# Patient Record
Sex: Male | Born: 1990 | Race: White | Hispanic: No | Marital: Single | Smoking: Never smoker
Health system: Southern US, Community
[De-identification: ages and names within clinical notes are randomized; demographics above are authoritative.]

---

## 2020-10-16 ENCOUNTER — Emergency Department (HOSPITAL_BASED_OUTPATIENT_CLINIC_OR_DEPARTMENT_OTHER): Payer: Worker's Compensation | Admitting: Radiology

## 2020-10-16 ENCOUNTER — Encounter (HOSPITAL_BASED_OUTPATIENT_CLINIC_OR_DEPARTMENT_OTHER): Payer: Self-pay | Admitting: Emergency Medicine

## 2020-10-16 ENCOUNTER — Other Ambulatory Visit: Payer: Self-pay

## 2020-10-16 ENCOUNTER — Emergency Department (HOSPITAL_BASED_OUTPATIENT_CLINIC_OR_DEPARTMENT_OTHER)
Admission: EM | Admit: 2020-10-16 | Discharge: 2020-10-16 | Disposition: A | Discharge status: Home / Self Care | Payer: Worker's Compensation | Attending: Emergency Medicine | Admitting: Emergency Medicine

## 2020-10-16 DIAGNOSIS — Z23 Encounter for immunization: Secondary | ICD-10-CM | POA: Insufficient documentation

## 2020-10-16 DIAGNOSIS — Y990 Civilian activity done for income or pay: Secondary | ICD-10-CM | POA: Insufficient documentation

## 2020-10-16 DIAGNOSIS — S5781XA Crushing injury of right forearm, initial encounter: Secondary | ICD-10-CM | POA: Diagnosis not present

## 2020-10-16 DIAGNOSIS — T148XXA Other injury of unspecified body region, initial encounter: Secondary | ICD-10-CM

## 2020-10-16 DIAGNOSIS — S471XXA Crushing injury of right shoulder and upper arm, initial encounter: Secondary | ICD-10-CM

## 2020-10-16 DIAGNOSIS — W3182XA Contact with other commercial machinery, initial encounter: Secondary | ICD-10-CM | POA: Diagnosis not present

## 2020-10-16 DIAGNOSIS — Y9389 Activity, other specified: Secondary | ICD-10-CM | POA: Insufficient documentation

## 2020-10-16 MED ORDER — TETANUS-DIPHTH-ACELL PERTUSSIS 5-2.5-18.5 LF-MCG/0.5 IM SUSY
0.5000 mL | PREFILLED_SYRINGE | Freq: Once | INTRAMUSCULAR | Status: AC
  Administered 2020-10-16: 0.5 mL via INTRAMUSCULAR
  Filled 2020-10-16: qty 0.5
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
## 2020-10-16 NOTE — ED Notes (Signed)
Accidentally clicked off DG Hand Complete Right

## 2020-10-16 NOTE — Discharge Instructions (Addendum)
Return to emergency room if you have increased swelling, increased pain in the arm, numbness in your fingers or difficulty moving your hand.  You can take ibuprofen (advil/motrin) every 6-8 hours and Tylenol every 4-6 hours.  Follow-up with a hand specialist as discussed.

## 2020-10-16 NOTE — ED Notes (Signed)
This RN presented the AVS utilizing Teachback Method. Patient verbalizes understanding of Discharge Instructions. Opportunity for Questioning and Answers were provided. Patient Discharged from ED ambulatory to Home with Friend  

## 2020-10-16 NOTE — ED Provider Notes (Addendum)
[facility] EMERGENCY DEPT Provider Note   CSN: [id number] Arrival date & time: 10/16/20  1656     History Chief Complaint  Patient presents with  . Arm Injury    Daniel Sheppard is a 30 y.o. male.  Patient is a 30 year old male who presents with a crush injury to his right arm at work.  He was working with fabric and his hand got pulled into the machine.  The interpreter at bedside states that it was in the machine for about 1 to 2 minutes and they were able to free it.  He has a hard abrasion present.  He denies any numbness or weakness to the hand.  His tetanus shot is not up-to-date.  He denies any other injuries.  This happened just prior to arrival.        History reviewed. No pertinent past medical history.  There are no problems to display for this patient.   History reviewed. No pertinent surgical history.     No family history on file.  Social History   Tobacco Use  . Smoking status: Never Smoker  Substance Use Topics  . Alcohol use: Not Currently  . Drug use: Not Currently    Home Medications Prior to Admission medications   Not on File    Allergies    Patient has no known allergies.  Review of Systems   Review of Systems  Constitutional: Negative for fever.  Gastrointestinal: Negative for nausea and vomiting.  Musculoskeletal: Positive for arthralgias, joint swelling and myalgias. Negative for back pain and neck pain.  Skin: Positive for wound.  Neurological: Negative for weakness, numbness and headaches.    Physical Exam Updated Vital Signs BP 115/74   Pulse (!) 58   Temp 98.6 F (37 C) (Oral)   Resp 20   SpO2 100%   Physical Exam Constitutional:      Appearance: He is well-developed.  HENT:     Head: Normocephalic and atraumatic.  Cardiovascular:     Rate and Rhythm: Normal rate.  Pulmonary:     Effort: Pulmonary effort is normal.  Musculoskeletal:        General: Tenderness present.     Cervical back:  Normal range of motion and neck supple.     Comments: Positive abrasion throughout the dorsum of the arm extending from the dorsal surface of the hand up to the mid forearm.  There is no active bleeding.  No deeper lacerations.  Minimal swelling is noted.  Compartments are soft throughout the forearm and hand.  He is able to move his fingers without significant pain.  He has normal sensation in all nerve distributions of the hand.  Motor function in the hand is intact.  He is able to flex and extend the wrist without difficulty.  Radial pulses are intact.  He has normal capillary refill distally.  Skin:    General: Skin is warm and dry.  Neurological:     Mental Status: He is alert and oriented to person, place, and time.     ED Results / Procedures / Treatments   Labs (all labs ordered are listed, but only abnormal results are displayed) Labs Reviewed - No data to display  EKG None  Radiology DG Forearm Right  Result Date: 10/16/2020 CLINICAL DATA:  Crush injury. EXAM: RIGHT FOREARM - 2 VIEW COMPARISON:  None. FINDINGS: Cortical margins of the radius and ulna are intact. There is no evidence of fracture or other focal bone lesions. Wrist and elbow  alignment are maintained. Mild soft tissue edema. No radiopaque foreign body. IMPRESSION: Mild soft tissue edema. No fracture. Electronically Signed   By: Narda Rutherford M.D.   On: 10/16/2020 18:04   DG Hand Complete Right  Result Date: 10/16/2020 CLINICAL DATA:  Crush injury. EXAM: RIGHT HAND - COMPLETE 3+ VIEW COMPARISON:  None. FINDINGS: There is no evidence of fracture or dislocation. There is no evidence of arthropathy or other focal bone abnormality. Soft tissue edema overlies the dorsum of the hand. No radiopaque foreign body. IMPRESSION: Dorsal soft tissue edema. No fracture or subluxation. Electronically Signed   By: Narda Rutherford M.D.   On: 10/16/2020 18:04    Procedures Procedures   Medications Ordered in ED Medications   Tdap (BOOSTRIX) injection 0.5 mL (has no administration in time range)    ED Course  I have reviewed the triage vital signs and the nursing notes.  Pertinent labs & imaging results that were available during my care of the patient were reviewed by me and considered in my medical decision making (see chart for details).    MDM Rules/Calculators/A&P                          No fractures are identified on x-ray.  There is no evidence for compartment syndrome.  His wounds are superficial.  They were cleaned and dressed with antibiotic ointment.  He was given ongoing wound care instructions.  He was advised to keep his arm elevated to assist with swelling.  He was given signs to look out for for possible compartment syndrome and advised to return if he has any the symptoms.  He was given referral to follow-up with hand.  He was advised that he will have to call make an appointment.  Return precautions were given. Final Clinical Impression(s) / ED Diagnoses Final diagnoses:  Abrasion  Crush injury arm, right, initial encounter    Rx / DC Orders ED Discharge Orders    None       Rolan Bucco, MD 10/16/20 1851    Rolan Bucco, MD 10/16/20 [phone]

## 2020-10-16 NOTE — ED Notes (Signed)
Right hand cleansed and dressed with bacitracin and nonadherent dressing applied.

## 2020-10-16 NOTE — ED Triage Notes (Signed)
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 States a machine fell on pt at work - unable to report how heavy machine was, states machine was on arm for approx 2-3 minutes.

## 2020-12-21 ENCOUNTER — Ambulatory Visit (INDEPENDENT_AMBULATORY_CARE_PROVIDER_SITE_OTHER): Payer: BC Managed Care – PPO | Admitting: Family Medicine

## 2020-12-21 ENCOUNTER — Other Ambulatory Visit: Payer: Self-pay

## 2020-12-21 DIAGNOSIS — G8929 Other chronic pain: Secondary | ICD-10-CM

## 2020-12-21 DIAGNOSIS — M545 Low back pain, unspecified: Secondary | ICD-10-CM | POA: Diagnosis not present
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
## 2021-01-18 ENCOUNTER — Ambulatory Visit: Payer: Medicaid Other | Attending: Family Medicine

## 2021-01-18 ENCOUNTER — Other Ambulatory Visit: Payer: Self-pay

## 2021-01-18 DIAGNOSIS — G8929 Other chronic pain: Secondary | ICD-10-CM | POA: Diagnosis present

## 2021-01-18 DIAGNOSIS — M545 Low back pain, unspecified: Secondary | ICD-10-CM | POA: Insufficient documentation

## 2021-01-18 DIAGNOSIS — R262 Difficulty in walking, not elsewhere classified: Secondary | ICD-10-CM | POA: Diagnosis present
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
## 2021-01-18 NOTE — Patient Instructions (Signed)
Access Code: [email or web address] URL: [email or web address] Date: 01/18/2021 Prepared by: Sharol Roussel  Exercises Quadruped Hip Hike on Foam - 1 x daily - 7 x weekly - 1 sets - 10 reps Supine Bridge with Mini Swiss Ball Between Knees - 1 x daily - 7 x weekly - 1 sets - 20 reps - 5 hold Supine Posterior Pelvic Tilt - 1 x daily - 7 x weekly - 1 sets - 15 reps - 5 hold

## 2021-01-27 ENCOUNTER — Ambulatory Visit: Payer: Medicaid Other

## 2021-01-27 ENCOUNTER — Other Ambulatory Visit: Payer: Self-pay

## 2021-01-27 DIAGNOSIS — G8929 Other chronic pain: Secondary | ICD-10-CM

## 2021-01-27 DIAGNOSIS — M545 Low back pain, unspecified: Secondary | ICD-10-CM | POA: Diagnosis not present

## 2021-01-27 DIAGNOSIS — R262 Difficulty in walking, not elsewhere classified: Secondary | ICD-10-CM

## 2021-01-27 NOTE — Patient Instructions (Signed)
  [id number]

## 2021-01-27 NOTE — Therapy (Signed)
[facility] [address] [place], [place], [address] Phone: [phone]   Fax:  [phone]  Physical Therapy Treatment  Patient Details  Name: Daniel Sheppard MRN: [id number] Date of Birth: 06/20/1990 Referring Provider (PT): Lavada Mesi, MD   Encounter Date: 01/27/2021   PT End of Session - 01/27/21 1555     Visit Number 2    Number of Visits 16    Date for PT Re-Evaluation 03/15/21    Authorization Type [place] M[facility]    Authorization Time Period 3 visits approved from 01/27/2021- 02/16/2021    Authorization - Visit Number 1    Authorization - Number of Visits 3    Progress Note Due on Visit 3    PT Start Time 1445    PT Stop Time 1530    PT Time Calculation (min) 45 min    Activity Tolerance Patient tolerated treatment well    Behavior During Therapy [facility] for tasks assessed/performed             History reviewed. No pertinent past medical history.  History reviewed. No pertinent surgical history.  There were no vitals filed for this visit.   Subjective Assessment - 01/27/21 1449     Subjective Pt reports that while sitting, his back feels fine, but when at work his back hurts and this leads to shooting pain down his R LE to his big toe. He reports that he has been doing his HEP regularly, although his pain presentation has not changed since starting.    Patient is accompained by: Interpreter   Alice, [phone]   Limitations Standing;Walking;Lifting    Currently in Pain? No/denies    Pain Score 0-No pain                [facility] PT Assessment - 01/27/21 0001       Palpation   Spinal mobility Hypermobile CPA's L1-L5, no reproduction on pain      Special Tests    Special Tests Lumbar    Other special tests Plank test (+), pt terminated at 35 sec due to pain    Lumbar Tests other      other   Findings Positive    Comments Directional preference established into lumbar extension (centralization of pain after  20 reps)                           [facility] Adult PT Treatment/Exercise - 01/27/21 0001       Lumbar Exercises: Standing   Other Standing Lumbar Exercises Repeated trunk extension 3x10      Lumbar Exercises: Supine   Large Ball Abdominal Isometric Limitations 3x30sec with green physioball      Lumbar Exercises: Sidelying   Other Sidelying Lumbar Exercises Trunk side bend with hips on Bosu ball on table 2x10 BIL      Lumbar Exercises: Prone   Other Prone Lumbar Exercises Trunk extension with hips on Bosu ball on table 3x10                    PT Education - 01/27/21 1555     Education Details Updated HEP    Person(s) Educated Patient    Methods Explanation;Demonstration;Handout    Comprehension Verbalized understanding;Returned demonstration              PT Short Term Goals - 01/18/21 1616       PT SHORT TERM GOAL #1   Title The  patient will be independent with a basic HEP.    Time 2    Period Weeks    Status New    Target Date 02/01/21               PT Long Term Goals - 01/18/21 1617       PT LONG TERM GOAL #1   Title The patient will be able to stand for 1 hour with low back pain under 4/10.    Baseline 9/10    Time 8    Period Weeks    Status New    Target Date 03/15/21      PT LONG TERM GOAL #2   Title The patient will be independent with an advanced HEP    Baseline no HEP    Time 8    Period Weeks    Status New    Target Date 03/15/21      PT LONG TERM GOAL #3   Title The patient will be able to walk 1-2 mile with low back pain under 4/10    Baseline 9/10 pain    Time 8    Period Weeks    Status New    Target Date 03/15/21                   Plan - 01/27/21 1556     Clinical Impression Statement Pt responded well to all treatment today. Upon additional assessment of his lumbar spine, ruling up likelihood of lumbar spinal instability; The pt demonstrated a positive plank test and hypermobility  throughout the lumbar spine. Additionally, the pt demonstrates a directional preference into lumbar extension. He responded well to all interventions today, with good form and no increase in pain with all exercises. He will continue to benefit from skilled PT to address his primary impairments and return to his prior level of function without limitation.    Personal Factors and Comorbidities Social Background    Examination-Activity Limitations Stand;Lift;Locomotion Level    Examination-Participation Restrictions Occupation;Meal Prep;Cleaning;Yard Work;Shop    Stability/Clinical Decision Making Stable/Uncomplicated    Clinical Decision Making Low    Rehab Potential Good    PT Frequency 2x / week    PT Duration 8 weeks    PT Treatment/Interventions ADLs/Self Care Home Management;Aquatic Therapy;Cryotherapy;Electrical Stimulation;Ultrasound;Traction;Moist Heat;Iontophoresis 4mg /ml Dexamethasone;Gait training;Stair training;Functional mobility training;Therapeutic activities;Therapeutic exercise;Neuromuscular re-education;Patient/family education;Manual techniques;Joint Manipulations;Spinal Manipulations;Dry needling;Taping    PT Next Visit Plan Core/ hip strengthening    PT Home Exercise Plan Access Code:    Consulted and Agree with Plan of Care Patient             Patient will benefit from skilled therapeutic intervention in order to improve the following deficits and impairments:  Difficulty walking, Decreased range of motion, Decreased activity tolerance, Pain, Decreased mobility, Decreased strength  Visit Diagnosis: Chronic bilateral low back pain without sciatica  Difficulty in walking, not elsewhere classified     Problem List There are no problems to display for this patient.   [id number], PT, DPT 01/27/21 4:04 PM   [facility] [address] [place], [place], [place] Phone: [phone]   Fax:   [phone]  Name: Daniel Sheppard MRN: Tito Dine Date of Birth: 1990/11/11

## 2021-02-01 ENCOUNTER — Ambulatory Visit: Payer: Medicaid Other

## 2021-02-16 ENCOUNTER — Ambulatory Visit: Payer: Medicaid Other | Attending: Family Medicine

## 2021-02-16 ENCOUNTER — Other Ambulatory Visit: Payer: Self-pay

## 2021-02-16 DIAGNOSIS — R262 Difficulty in walking, not elsewhere classified: Secondary | ICD-10-CM | POA: Insufficient documentation

## 2021-02-16 DIAGNOSIS — M545 Low back pain, unspecified: Secondary | ICD-10-CM | POA: Diagnosis not present

## 2021-02-16 DIAGNOSIS — G8929 Other chronic pain: Secondary | ICD-10-CM | POA: Diagnosis present

## 2021-02-16 NOTE — Therapy (Addendum)
[place], [place], [address] Phone: [phone]   Fax:  [phone]  Physical Therapy Treatment/ Discharge Summary  Progress Note Reporting Period 01/18/2021 to 02/16/2021   See note below for Objective Data and Assessment of Progress/Goals.      Patient Details  Name: Daniel Sheppard MRN: [id number] Date of Birth: May 14, 1991 Referring Provider (PT): Eunice Blase, MD   Encounter Date: 02/16/2021   PT End of Session - 02/16/21 1533     Visit Number 3    Number of Visits 16    Date for PT Re-Evaluation 03/15/21    Authorization Type [place] M[facility]    Authorization Time Period 3 visits approved from 01/27/2021- 02/16/2021    Authorization - Visit Number 2    Authorization - Number of Visits 3    PT Start Time 0354    PT Stop Time 1530    PT Time Calculation (min) 45 min    Activity Tolerance Patient tolerated treatment well    Behavior During Therapy [facility] for tasks assessed/performed             History reviewed. No pertinent past medical history.  History reviewed. No pertinent surgical history.  There were no vitals filed for this visit.   Subjective Assessment - 02/16/21 1450     Subjective Pt reports that his low back pain and R LE sxs are similar to when he started PT, but he no longer has L LE sxs. He reports being moderately adherent to his HEP, not performing all his exercises due to having a cold.    Patient is accompained by: Interpreter   Alene Mires, [phone]   How long can you stand comfortably? 10-15 minutes    How long can you walk comfortably? 20-30 minutes    Currently in Pain? No/denies    Pain Score 0-No pain                [facility] PT Assessment - 02/16/21 0001       AROM   Lumbar Flexion 100%    Lumbar Extension 60% with pain    Lumbar - Right Side Bend WNL    Lumbar - Left Side Bend WNL    Lumbar - Right Rotation WNL    Lumbar - Left Rotation WNL      Strength    Right Hip Flexion 4+/5    Left Hip Flexion 4+/5                           [facility] Adult PT Treatment/Exercise - 02/16/21 0001       Lumbar Exercises: Stretches   Other Lumbar Stretch Exercise Cobra stretch 3x30sec      Lumbar Exercises: Standing   Other Standing Lumbar Exercises Repeated trunk extension 3x10      Lumbar Exercises: Seated   Other Seated Lumbar Exercises Sciatic nerve glides 2x20 on R      Lumbar Exercises: Sidelying   Other Sidelying Lumbar Exercises Trunk side bend with hips on Bosu ball on table 2x10 BIL      Lumbar Exercises: Prone   Other Prone Lumbar Exercises Trunk extension with hips on Bosu ball on table 3x10    Other Prone Lumbar Exercises Standard plank 3x to exhaustion                     PT Education - 02/16/21 1530     Education Details Updated HEP.  Educated pt on significance of progress made thus far in PT, along with POC moving forward.    Person(s) Educated Patient    Methods Explanation;Demonstration;Handout    Comprehension Returned demonstration;Verbalized understanding              PT Short Term Goals - 02/16/21 1502       PT SHORT TERM GOAL #1   Title The patient will be independent with a basic HEP.    Baseline pt reports adherence to his HEP    Time 2    Period Weeks    Status Achieved    Target Date 02/01/21               PT Long Term Goals - 02/16/21 1536       PT LONG TERM GOAL #1   Title The patient will be able to stand for 1 hour with low back pain under 4/10.    Baseline 10-15 minutes with 5/10 pain: 02/16/2021    Time 8    Period Weeks    Status On-going      PT LONG TERM GOAL #2   Title The patient will be independent with an advanced HEP    Baseline Adherent    Time 8    Period Weeks    Status Achieved      PT LONG TERM GOAL #3   Title The patient will be able to walk 1-2 mile with low back pain under 4/10    Baseline able to walk about 30 minutes before moderate  (5/10) pain    Time 8    Period Weeks    Status On-going      PT LONG TERM GOAL #4   Title Pt will achieve centralization of pain with no LE sxs in order to work without limitation.    Baseline radicular sxs into R LE    Time 4    Period Weeks    Status New    Target Date 03/15/21                   Plan - 02/16/21 1534     Clinical Impression Statement Pt responded well to all interventions today, with good form and no increase in pain with all exercises. He reports improved functional ability since starting PT, with improved standing and walking times. He also has improved spinal mobility and shows early signs of centralization of pain with no sxs in his L LE over the past week. He will continue to benefit from skilled PT to address his primary impairments and return to his prior level of function without limitation.    Personal Factors and Comorbidities Social Background    Examination-Activity Limitations Stand;Lift;Locomotion Level    Examination-Participation Restrictions Occupation;Meal Prep;Cleaning;Yard Work;Shop    Stability/Clinical Decision Making Stable/Uncomplicated    Clinical Decision Making Low    Rehab Potential Good    PT Frequency 2x / week    PT Duration 8 weeks    PT Treatment/Interventions ADLs/Self Care Home Management;Aquatic Therapy;Cryotherapy;Electrical Stimulation;Ultrasound;Traction;Moist Heat;Iontophoresis 34m/ml Dexamethasone;Gait training;Stair training;Functional mobility training;Therapeutic activities;Therapeutic exercise;Neuromuscular re-education;Patient/family education;Manual techniques;Joint Manipulations;Spinal Manipulations;Dry needling;Taping    PT Next Visit Plan Core/ hip strengthening, manual PRN    PT Home Exercise Plan Access Code: [email or web address]   Consulted and Agree with Plan of Care Patient             Patient will benefit from skilled therapeutic intervention in order to improve the following deficits and impairments:  Difficulty walking, Decreased range of motion, Decreased activity tolerance, Pain, Decreased mobility, Decreased strength  Visit Diagnosis: Chronic bilateral low back pain without sciatica  Difficulty in walking, not elsewhere classified     Problem List There are no problems to display for this patient.   Vanessa Petroleum, PT, DPT 02/16/21 3:40 PM   [place] [facility] [address] [place], [place], [address] Phone: [phone]   Fax:  [phone]  Name: Daniel Sheppard MRN: [id number] Date of Birth: 22-May-1991  PHYSICAL THERAPY DISCHARGE SUMMARY  Visits from Start of Care: 3  Current functional level related to goals / functional outcomes: Unable to assess   Remaining deficits: Unable to assess   Education / Equipment: HEP   Patient agrees to discharge. Patient goals were not met. Patient is being discharged due to not returning since the last visit.  Vanessa Piatt, PT, DPT 03/22/21 9:08 AM

## 2021-02-16 NOTE — Patient Instructions (Signed)
  [id number] 

## 2021-05-10 NOTE — Congregational Nurse Program (Signed)
  Dept: [phone]   Congregational Nurse Program Note  Date of Encounter: 05/10/2021  Past Medical History: No past medical history on file.  Encounter Details:  CNP Questionnaire - 05/10/21 1255       Questionnaire   Location Patient Served  [facility]    Visit Setting Church or Organization    Patient Status Refugee    Insurance [facility]    Insurance Referral N/A    Medication N/A    Medical Provider No    Medical Referral [facility]    Medical Appointment Made [facility]    Food N/A    Transportation N/A    Housing/Utilities N/A    Interpersonal Safety N/A    Intervention [employer];Case Management;Support    ED Visit Averted Yes    Life-Saving Intervention Made N/A            Patient came in c/o burning sensation while voiding with some penile discharge. Referred to [facility].  Nicole Cella Errol Ala RN BSn PCCN  [facility] [phone]

## 2021-06-30 ENCOUNTER — Other Ambulatory Visit: Payer: Self-pay

## 2021-06-30 ENCOUNTER — Encounter: Payer: Self-pay | Admitting: Family

## 2021-06-30 ENCOUNTER — Other Ambulatory Visit (HOSPITAL_COMMUNITY)
Admission: RE | Admit: 2021-06-30 | Discharge: 2021-06-30 | Disposition: A | Discharge status: Home / Self Care | Payer: BC Managed Care – PPO | Source: Ambulatory Visit | Attending: Family | Admitting: Family

## 2021-06-30 ENCOUNTER — Ambulatory Visit: Payer: BC Managed Care – PPO | Admitting: Family

## 2021-06-30 VITALS — BP 108/59 | HR 64 | Temp 98.5°F | Ht 74.0 in | Wt 165.6 lb

## 2021-06-30 DIAGNOSIS — R102 Pelvic and perineal pain: Secondary | ICD-10-CM

## 2021-06-30 DIAGNOSIS — M5441 Lumbago with sciatica, right side: Secondary | ICD-10-CM

## 2021-06-30 DIAGNOSIS — G8929 Other chronic pain: Secondary | ICD-10-CM | POA: Diagnosis not present

## 2021-06-30 LAB — POCT URINALYSIS DIPSTICK
Bilirubin, UA: NEGATIVE
Blood, UA: NEGATIVE
Glucose, UA: NEGATIVE
Ketones, UA: NEGATIVE
Leukocytes, UA: NEGATIVE
Nitrite, UA: NEGATIVE
Protein, UA: NEGATIVE
Spec Grav, UA: 1.02 (ref 1.010–1.025)
Urobilinogen, UA: 0.2 E.U./dL
pH, UA: 6.5 (ref 5.0–8.0)

## 2021-06-30 NOTE — Assessment & Plan Note (Addendum)
pt reports "bladder pain" hurts after urination and pain in right lumbar. denies hematuria or hx of STD or nephrolithiasis. UA neg. advised on proper daily water hydration, not holding urine during day.

## 2021-06-30 NOTE — Patient Instructions (Addendum)
It was very nice to see you today!  Contact the Orthopedic office that saw you for your back pain last summer for follow up.  Located at [address] [place]. [phone]  Your urine is negative for any urinary tract infection, I am sending it out to test for other sexually transmitted diseases that can cause your symptoms and we will call you in a few days with the results. Be sure to drink at least 2 liters of water daily so that your urine is light yellow or clear in color for keeping a healthy bladder.    PLEASE NOTE:  If you had any lab tests please let us know if you have not heard back within a few days. You may see your results on MyChart before we have a chance to review them but we will give you a call once they are reviewed by [name]. If we ordered any referrals today, please let us know if you have not heard from their office within the next week.   Please try these tips to maintain a healthy lifestyle:  Eat most of your calories during the day when you are active. Eliminate processed foods including packaged sweets (pies, cakes, cookies), reduce intake of potatoes, white bread, white pasta, and white rice. Look for whole grain options, oat flour or almond flour.  Each meal should contain half fruits/vegetables, one quarter protein, and one quarter carbs (no bigger than a computer mouse).  Cut down on sweet beverages. This includes juice, soda, and sweet tea. Also watch fruit intake, though this is a healthier sweet option, it still contains natural sugar! Limit to 3 servings daily.  Drink at least 1 glass of water with each meal and aim for at least 8 glasses per day  Exercise at least 150 minutes every week.

## 2021-06-30 NOTE — Progress Notes (Signed)
Subjective:     Patient ID: Daniel Sheppard, male    DOB: [birth year], 31 y.o.   MRN: [id number]  Chief Complaint  Patient presents with   Establish Care   Back Pain    Lower; Starting a few years ago.    Urinary Urgency    HPI: Pain He reports chronic back pain. There was not an injury that may have caused the pain. The pain started  7 months ago and is gradually worsening. The pain does radiate right leg. The pain is described as burning and throbbing, is moderate in intensity, occurring intermittently. Symptoms are worse in the: evening, nighttime  Aggravating factors: sitting, standing, walking, and walking uphill He has tried application of heat, NSAIDs, and physical thearpy with mild relief.   Urinary symptoms: Patient c/o  dysuria,  pelvic pain, low back pain, incomplete emptying of bladder. Other sx: penile d/c none, penile itching none. Duration of sx: 3 weeks; Home tx: nothing; Denies  nausea, fever. Reports last UTI years ago in [place].     Health Maintenance Due  Topic Date Due   HIV Screening  Never done   Hepatitis C Screening  Never done    History reviewed. No pertinent past medical history.  History reviewed. No pertinent surgical history.  No outpatient medications prior to visit.   No facility-administered medications prior to visit.    No Known Allergies      Objective:    Physical Exam Vitals and nursing note reviewed.  Constitutional:      General: He is not in acute distress.    Appearance: Normal appearance.  HENT:     Head: Normocephalic.  Cardiovascular:     Rate and Rhythm: Normal rate and regular rhythm.  Pulmonary:     Effort: Pulmonary effort is normal.     Breath sounds: Normal breath sounds.  Musculoskeletal:        General: Normal range of motion.     Cervical back: Normal range of motion.  Skin:    General: Skin is warm and dry.  Neurological:     Mental Status: He is alert and oriented to person, place, and time.   Psychiatric:        Mood and Affect: Mood normal.   BP (!) 108/59    Pulse 64    Temp 98.5 F (36.9 C) (Temporal)    Ht 6\' 2"  (1.88 m)    Wt 165 lb 9.6 oz (75.1 kg)    SpO2 96%    BMI 21.26 kg/m  Wt Readings from Last 3 Encounters:  06/30/21 165 lb 9.6 oz (75.1 kg)       Assessment & Plan:   Problem List Items Addressed This Visit       Nervous and Auditory   Chronic bilateral low back pain with right-sided sciatica    pt has been evaluated last summer, told to f/u if PT did not resolve sx. he states it helped, but sx are returning, he was told to return for f/u, providing phone # today to call.         Other   Pelvic pain - Primary    pt reports "bladder pain" hurts after urination and pain in right lumbar. denies hematuria or hx of STD or nephrolithiasis. UA neg. advised on proper daily water hydration, not holding urine during day.       Relevant Orders   POCT Urinalysis Dipstick (Completed)   Urine cytology ancillary only   *Due to  language barrier, an interpreter was present during the history-taking and subsequent discussion (and for part of the physical exam) with this patient.

## 2021-06-30 NOTE — Assessment & Plan Note (Signed)
pt has been evaluated last summer, told to f/u if PT did not resolve sx. he states it helped, but sx are returning, he was told to return for f/u, providing phone # today to call.

## 2021-07-01 LAB — URINE CYTOLOGY ANCILLARY ONLY
Bacterial Vaginitis-Urine: NEGATIVE
Candida Urine: NEGATIVE
Chlamydia: NEGATIVE
Comment: NEGATIVE
Comment: NEGATIVE
Comment: NORMAL
Neisseria Gonorrhea: NEGATIVE
Trichomonas: NEGATIVE

## 2021-07-01 NOTE — Progress Notes (Signed)
urine negative for chlamydia, gonorrhea,  trichomonas, or yeast.

## 2021-07-06 ENCOUNTER — Other Ambulatory Visit: Payer: Self-pay

## 2021-07-06 ENCOUNTER — Encounter: Payer: Self-pay | Admitting: Orthopaedic Surgery

## 2021-07-06 ENCOUNTER — Ambulatory Visit: Payer: Self-pay

## 2021-07-06 ENCOUNTER — Ambulatory Visit: Payer: BC Managed Care – PPO | Admitting: Orthopaedic Surgery

## 2021-07-06 DIAGNOSIS — G8929 Other chronic pain: Secondary | ICD-10-CM | POA: Diagnosis not present

## 2021-07-06 DIAGNOSIS — M545 Low back pain, unspecified: Secondary | ICD-10-CM | POA: Diagnosis not present

## 2021-07-06 MED ORDER — DICLOFENAC SODIUM 75 MG PO TBEC: 75.0000 mg | DELAYED_RELEASE_TABLET | Freq: Two times a day (BID) | ORAL | 2 refills | Status: DC | PRN
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
## 2021-07-23 ENCOUNTER — Other Ambulatory Visit: Payer: Self-pay

## 2021-07-23 ENCOUNTER — Ambulatory Visit (INDEPENDENT_AMBULATORY_CARE_PROVIDER_SITE_OTHER): Payer: BC Managed Care – PPO | Admitting: Orthopaedic Surgery

## 2021-07-23 DIAGNOSIS — M545 Low back pain, unspecified: Secondary | ICD-10-CM

## 2021-07-23 DIAGNOSIS — G8929 Other chronic pain: Secondary | ICD-10-CM

## 2021-07-28 ENCOUNTER — Other Ambulatory Visit: Payer: Self-pay

## 2021-07-28 ENCOUNTER — Ambulatory Visit
Admission: RE | Admit: 2021-07-28 | Discharge: 2021-07-28 | Disposition: A | Discharge status: Home / Self Care | Payer: BC Managed Care – PPO | Source: Ambulatory Visit | Attending: Orthopaedic Surgery | Admitting: Orthopaedic Surgery

## 2021-07-28 ENCOUNTER — Other Ambulatory Visit: Payer: BC Managed Care – PPO

## 2021-07-28 DIAGNOSIS — M545 Low back pain, unspecified: Secondary | ICD-10-CM

## 2021-07-28 DIAGNOSIS — G8929 Other chronic pain: Secondary | ICD-10-CM

## 2021-07-29 ENCOUNTER — Other Ambulatory Visit: Payer: BC Managed Care – PPO

## 2021-08-03 ENCOUNTER — Ambulatory Visit: Payer: BC Managed Care – PPO | Admitting: Orthopaedic Surgery

## 2021-08-04 ENCOUNTER — Ambulatory Visit: Payer: BC Managed Care – PPO | Admitting: Internal Medicine

## 2021-09-28 ENCOUNTER — Encounter: Payer: Self-pay | Admitting: Orthopaedic Surgery

## 2021-09-28 ENCOUNTER — Ambulatory Visit (INDEPENDENT_AMBULATORY_CARE_PROVIDER_SITE_OTHER): Payer: BC Managed Care – PPO | Admitting: Orthopaedic Surgery

## 2021-09-28 DIAGNOSIS — G8929 Other chronic pain: Secondary | ICD-10-CM | POA: Diagnosis not present

## 2021-09-28 DIAGNOSIS — M5441 Lumbago with sciatica, right side: Secondary | ICD-10-CM | POA: Diagnosis not present
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
## 2021-09-29 ENCOUNTER — Other Ambulatory Visit: Payer: Self-pay

## 2021-09-29 DIAGNOSIS — G8929 Other chronic pain: Secondary | ICD-10-CM

## 2021-10-20 ENCOUNTER — Ambulatory Visit: Payer: BC Managed Care – PPO | Admitting: Internal Medicine

## 2021-11-08 ENCOUNTER — Encounter: Payer: Self-pay | Admitting: Physical Medicine and Rehabilitation

## 2021-11-08 ENCOUNTER — Ambulatory Visit (INDEPENDENT_AMBULATORY_CARE_PROVIDER_SITE_OTHER): Payer: BC Managed Care – PPO | Admitting: Physical Medicine and Rehabilitation

## 2021-11-08 ENCOUNTER — Ambulatory Visit: Payer: Self-pay

## 2021-11-08 VITALS — BP 113/70 | HR 80

## 2021-11-08 DIAGNOSIS — M5416 Radiculopathy, lumbar region: Secondary | ICD-10-CM

## 2021-11-08 MED ORDER — METHYLPREDNISOLONE ACETATE 80 MG/ML IJ SUSP
80.0000 mg | Freq: Once | INTRAMUSCULAR | Status: AC
  Administered 2021-11-08: 80 mg

## 2021-11-08 NOTE — Patient Instructions (Signed)

## 2021-11-08 NOTE — Progress Notes (Signed)
Pt state lower back pain that  travels down both legs, mostly right. Pt state he has pain behind his knees.Pt state walking and standing makes the pain worse. Pt state he takes over the counter pain meds to help ease his pain.  Numeric Pain Rating Scale and Functional Assessment Average Pain 8   In the last MONTH (on 0-10 scale) has pain interfered with the following?  1. General activity like being  able to carry out your everyday physical activities such as walking, climbing stairs, carrying groceries, or moving a chair?  Rating(9)   +Driver, -BT, -Dye Allergies.

## 2021-11-23 NOTE — Procedures (Signed)
Lumbar Epidural Steroid Injection - Interlaminar Approach with Fluoroscopic Guidance  Patient: Daniel Sheppard      Date of Birth: 1991-05-06 MRN: [id number] PCP: [name], PA-C      Visit Date: 11/08/2021   Universal Protocol:     Consent Given By: the patient  Position: PRONE  Additional Comments: Vital signs were monitored before and after the procedure. Patient was prepped and draped in the usual sterile fashion. The correct patient, procedure, and site was verified.   Injection Procedure Details:   Procedure diagnoses: Lumbar radiculopathy [M54.16]   Meds Administered:  Meds ordered this encounter  Medications   methylPREDNISolone acetate (DEPO-MEDROL) injection 80 mg     Laterality: Right  Location/Site:  L5-S1  Needle: 3.5 in., 20 ga. Tuohy  Needle Placement: Paramedian epidural  Findings:   -Comments: Excellent flow of contrast into the epidural space.  Procedure Details: Using a paramedian approach from the side mentioned above, the region overlying the inferior lamina was localized under fluoroscopic visualization and the soft tissues overlying this structure were infiltrated with 4 ml. of 1% Lidocaine without Epinephrine. The Tuohy needle was inserted into the epidural space using a paramedian approach.   The epidural space was localized using loss of resistance along with counter oblique bi-planar fluoroscopic views.  After negative aspirate for air, blood, and CSF, a 2 ml. volume of Isovue-250 was injected into the epidural space and the flow of contrast was observed. Radiographs were obtained for documentation purposes.    The injectate was administered into the level noted above.   Additional Comments:  The patient tolerated the procedure well Dressing: 2 x 2 sterile gauze and Band-Aid    Post-procedure details: Patient was observed during the procedure. Post-procedure instructions were reviewed.  Patient left the clinic in stable  condition.

## 2021-11-23 NOTE — Progress Notes (Signed)
Daniel Sheppard - 31 y.o. male MRN [id number]  Date of birth: September 14, 1990  Office Visit Note: Visit Date: 11/08/2021 PCP: [name], PA-C Referred by: [name], PA-C  Subjective: Chief Complaint  Patient presents with   Lower Back - Pain   Right Leg - Pain   Left Leg - Pain   HPI:  Daniel Sheppard is a 31 y.o. male who comes in today at the request of Dr. Glee Arvin for planned Midline L5-S1 Lumbar Interlaminar epidural steroid injection with fluoroscopic guidance.  The patient has failed conservative care including home exercise, medications, time and activity modification.  This injection will be diagnostic and hopefully therapeutic.  Please see requesting physician notes for further details and justification.   ROS Otherwise per HPI.  Assessment & Plan: Visit Diagnoses:    ICD-10-CM   1. Lumbar radiculopathy  M54.16 XR C-ARM NO REPORT    Epidural Steroid injection    methylPREDNISolone acetate (DEPO-MEDROL) injection 80 mg      Plan: No additional findings.   Meds & Orders:  Meds ordered this encounter  Medications   methylPREDNISolone acetate (DEPO-MEDROL) injection 80 mg    Orders Placed This Encounter  Procedures   XR C-ARM NO REPORT   Epidural Steroid injection    Follow-up: Return if symptoms worsen or fail to improve.   Procedures: No procedures performed  Lumbar Epidural Steroid Injection - Interlaminar Approach with Fluoroscopic Guidance  Patient: Daniel Sheppard      Date of Birth: May 27, 1991 MRN: [id number] PCP: [name], PA-C      Visit Date: 11/08/2021   Universal Protocol:     Consent Given By: the patient  Position: PRONE  Additional Comments: Vital signs were monitored before and after the procedure. Patient was prepped and draped in the usual sterile fashion. The correct patient, procedure, and site was verified.   Injection Procedure Details:   Procedure diagnoses: Lumbar radiculopathy [M54.16]   Meds  Administered:  Meds ordered this encounter  Medications   methylPREDNISolone acetate (DEPO-MEDROL) injection 80 mg     Laterality: Right  Location/Site:  L5-S1  Needle: 3.5 in., 20 ga. Tuohy  Needle Placement: Paramedian epidural  Findings:   -Comments: Excellent flow of contrast into the epidural space.  Procedure Details: Using a paramedian approach from the side mentioned above, the region overlying the inferior lamina was localized under fluoroscopic visualization and the soft tissues overlying this structure were infiltrated with 4 ml. of 1% Lidocaine without Epinephrine. The Tuohy needle was inserted into the epidural space using a paramedian approach.   The epidural space was localized using loss of resistance along with counter oblique bi-planar fluoroscopic views.  After negative aspirate for air, blood, and CSF, a 2 ml. volume of Isovue-250 was injected into the epidural space and the flow of contrast was observed. Radiographs were obtained for documentation purposes.    The injectate was administered into the level noted above.   Additional Comments:  The patient tolerated the procedure well Dressing: 2 x 2 sterile gauze and Band-Aid    Post-procedure details: Patient was observed during the procedure. Post-procedure instructions were reviewed.  Patient left the clinic in stable condition.   Clinical History: MRI LUMBAR SPINE WITHOUT CONTRAST   TECHNIQUE: Multiplanar, multisequence MR imaging of the lumbar spine was performed. No intravenous contrast was administered.   COMPARISON:  X-ray lumbar 07/06/2021.   FINDINGS: Segmentation: 5 non rib-bearing lumbar type vertebral bodies are present. The lowest fully formed vertebral body  is L5.   Alignment: No significant listhesis is present. Straightening of the normal lumbar lordosis is noted.   Vertebrae:  Marrow signal and vertebral body heights are normal.   Conus medullaris and cauda equina: Conus  extends to the L1 level. Conus and cauda equina appear normal.   Paraspinal and other soft tissues: Limited imaging the abdomen is unremarkable. There is no significant adenopathy. No solid organ lesions are present.   Disc levels:   L1-2: Mild loss of disc height noted. No significant focal protrusion or stenosis.   L2-3: Negative.   L3-4: Mild disc bulging is asymmetric to the left. Mild bilateral subarticular and foraminal narrowing is worse left than right.   L4-5: Broad-based disc protrusion is present. Mild subarticular narrowing is present bilaterally. Mild bilateral foraminal narrowing is present.   L5-S1: Disc desiccation noted. A mild central annular tear is present. No significant focal stenosis is present.   IMPRESSION: 1. Mild bilateral subarticular and foraminal narrowing bilaterally at L3-4 is worse left than right. 2. Mild subarticular and foraminal narrowing bilaterally at L4-5. 3. Mild central annular tear at L5-S1 without significant stenosis. 4. Straightening of the normal lumbar lordosis. This is nonspecific but can be seen in the setting of muscle strain or ongoing pain.     Electronically Signed   By: Marin Roberts M.D.   On: 07/28/2021 16:39     Objective:  VS:  HT:    WT:   BMI:     BP:113/70  HR:80bpm  TEMP: ( )  RESP:  Physical Exam Vitals and nursing note reviewed.  Constitutional:      General: He is not in acute distress.    Appearance: Normal appearance. He is not ill-appearing.  HENT:     Head: Normocephalic and atraumatic.     Right Ear: External ear normal.     Left Ear: External ear normal.     Nose: No congestion.  Eyes:     Extraocular Movements: Extraocular movements intact.  Cardiovascular:     Rate and Rhythm: Normal rate.     Pulses: Normal pulses.  Pulmonary:     Effort: Pulmonary effort is normal. No respiratory distress.  Abdominal:     General: There is no distension.     Palpations: Abdomen is soft.   Musculoskeletal:        General: No tenderness or signs of injury.     Cervical back: Neck supple.     Right lower leg: No edema.     Left lower leg: No edema.     Comments: Patient has good distal strength without clonus.  Skin:    Findings: No erythema or rash.  Neurological:     General: No focal deficit present.     Mental Status: He is alert and oriented to person, place, and time.     Sensory: No sensory deficit.     Motor: No weakness or abnormal muscle tone.     Coordination: Coordination normal.  Psychiatric:        Mood and Affect: Mood normal.        Behavior: Behavior normal.      Imaging: No results found.

## 2022-01-18 ENCOUNTER — Ambulatory Visit (INDEPENDENT_AMBULATORY_CARE_PROVIDER_SITE_OTHER): Payer: BC Managed Care – PPO | Admitting: Orthopaedic Surgery

## 2022-01-18 DIAGNOSIS — M5416 Radiculopathy, lumbar region: Secondary | ICD-10-CM

## 2022-01-18 NOTE — Progress Notes (Signed)
Office Visit Note   Patient: Daniel Sheppard           Date of Birth: May 01, 1991           MRN: [id number] Visit Date: 01/18/2022              Requested by: [name], PA-C [address] [place],  [place] [address] PCP: [name], PA-C   Assessment & Plan: Visit Diagnoses:  1. Lumbar radiculopathy     Plan: Patient returns today with interpreter for continued back pain.  Underwent lumbar spine ESI with Dr. Alvester Morin about 2 months ago.  The injection helped for a month only.  Feels like the pain is traveling down his legs.  Based on findings and options he would like a referral to a spine specialist.  Referral has been made to [facility].  Follow-Up Instructions: No follow-ups on file.   Orders:  Orders Placed This Encounter  Procedures   Ambulatory referral to Neurosurgery   No orders of the defined types were placed in this encounter.     Procedures: No procedures performed   Clinical Data: No additional findings.   Subjective: Chief Complaint  Patient presents with   Lower Back - Pain    HPI  Review of Systems   Objective: Vital Signs: There were no vitals taken for this visit.  Physical Exam  Ortho Exam  Specialty Comments:  MRI LUMBAR SPINE WITHOUT CONTRAST   TECHNIQUE: Multiplanar, multisequence MR imaging of the lumbar spine was performed. No intravenous contrast was administered.   COMPARISON:  X-ray lumbar 07/06/2021.   FINDINGS: Segmentation: 5 non rib-bearing lumbar type vertebral bodies are present. The lowest fully formed vertebral body is L5.   Alignment: No significant listhesis is present. Straightening of the normal lumbar lordosis is noted.   Vertebrae:  Marrow signal and vertebral body heights are normal.   Conus medullaris and cauda equina: Conus extends to the L1 level. Conus and cauda equina appear normal.   Paraspinal and other soft tissues: Limited imaging the abdomen  is unremarkable. There is no significant adenopathy. No solid organ lesions are present.   Disc levels:   L1-2: Mild loss of disc height noted. No significant focal protrusion or stenosis.   L2-3: Negative.   L3-4: Mild disc bulging is asymmetric to the left. Mild bilateral subarticular and foraminal narrowing is worse left than right.   L4-5: Broad-based disc protrusion is present. Mild subarticular narrowing is present bilaterally. Mild bilateral foraminal narrowing is present.   L5-S1: Disc desiccation noted. A mild central annular tear is present. No significant focal stenosis is present.   IMPRESSION: 1. Mild bilateral subarticular and foraminal narrowing bilaterally at L3-4 is worse left than right. 2. Mild subarticular and foraminal narrowing bilaterally at L4-5. 3. Mild central annular tear at L5-S1 without significant stenosis. 4. Straightening of the normal lumbar lordosis. This is nonspecific but can be seen in the setting of muscle strain or ongoing pain.     Electronically Signed   By: Marin Roberts M.D.   On: 07/28/2021 16:39  Imaging: No results found.   PMFS History: Patient Active Problem List   Diagnosis Date Noted   Pelvic pain 06/30/2021   Chronic bilateral low back pain with right-sided sciatica 06/30/2021   No past medical history on file.  No family history on file.  No past surgical history on file. Social History   Occupational History   Not on file  Tobacco Use  Smoking status: Never   Smokeless tobacco: Not on file  Substance and Sexual Activity   Alcohol use: Not Currently   Drug use: Not Currently   Sexual activity: Not on file

## 2022-02-21 ENCOUNTER — Ambulatory Visit (INDEPENDENT_AMBULATORY_CARE_PROVIDER_SITE_OTHER): Payer: BC Managed Care – PPO | Admitting: Physical Medicine and Rehabilitation

## 2022-02-21 ENCOUNTER — Ambulatory Visit: Payer: Self-pay

## 2022-02-21 DIAGNOSIS — M5416 Radiculopathy, lumbar region: Secondary | ICD-10-CM | POA: Diagnosis not present

## 2022-02-21 DIAGNOSIS — M48061 Spinal stenosis, lumbar region without neurogenic claudication: Secondary | ICD-10-CM

## 2022-02-21 MED ORDER — METHYLPREDNISOLONE ACETATE 80 MG/ML IJ SUSP
40.0000 mg | Freq: Once | INTRAMUSCULAR | Status: AC
  Administered 2022-02-21: 40 mg

## 2022-02-21 NOTE — Progress Notes (Signed)
Numeric Pain Rating Scale and Functional Assessment Average Pain 1   In the last MONTH (on 0-10 scale) has pain interfered with the following?  1. General activity like being  able to carry out your everyday physical activities such as walking, climbing stairs, carrying groceries, or moving a chair?  Rating(9)   -Driver, -BT, -Dye Allergies.  125/77

## 2022-02-21 NOTE — Patient Instructions (Signed)

## 2022-02-21 NOTE — Procedures (Signed)
Lumbar Epidural Steroid Injection - Interlaminar Approach with Fluoroscopic Guidance  Patient: Daniel Sheppard      Date of Birth: 11-30-1990 MRN: [id number] PCP: Gustavus Bryant, PA-C      Visit Date: 02/21/2022   Universal Protocol:     Consent Given By: the patient  Position: PRONE  Additional Comments: Vital signs were monitored before and after the procedure. Patient was prepped and draped in the usual sterile fashion. The correct patient, procedure, and site was verified.   Injection Procedure Details:   Procedure diagnoses: Lumbar radiculopathy [M54.16]   Meds Administered:  Meds ordered this encounter  Medications   methylPREDNISolone acetate (DEPO-MEDROL) injection 40 mg     Laterality: Right  Location/Site:  L4-5  Needle: 3.5 in., 20 ga. Tuohy  Needle Placement: Paramedian epidural  Findings:   -Comments: Excellent flow of contrast into the epidural space.  Procedure Details: Using a paramedian approach from the side mentioned above, the region overlying the inferior lamina was localized under fluoroscopic visualization and the soft tissues overlying this structure were infiltrated with 4 ml. of 1% Lidocaine without Epinephrine. The Tuohy needle was inserted into the epidural space using a paramedian approach.   The epidural space was localized using loss of resistance along with counter oblique bi-planar fluoroscopic views.  After negative aspirate for air, blood, and CSF, a 2 ml. volume of Isovue-250 was injected into the epidural space and the flow of contrast was observed. Radiographs were obtained for documentation purposes.    The injectate was administered into the level noted above.   Additional Comments:  The patient tolerated the procedure well Dressing: 2 x 2 sterile gauze and Band-Aid    Post-procedure details: Patient was observed during the procedure. Post-procedure instructions were reviewed.  Patient left the clinic in stable  condition.

## 2022-02-21 NOTE — Progress Notes (Signed)
Daniel Sheppard - 31 y.o. male MRN [id number]  Date of birth: July 12, 1990  Office Visit Note: Visit Date: 02/21/2022 PCP: [name], PA-C Referred by: Bedelia Person, MD  Subjective: Chief Complaint  Patient presents with   Lower Back - Pain   HPI:  Daniel Sheppard is a 31 y.o. male who comes in today at the request of Dr. Hoyt Koch for planned Right L4-5 Lumbar Interlaminar epidural steroid injection with fluoroscopic guidance.  The patient has failed conservative care including home exercise, medications, time and activity modification.  This injection will be diagnostic and hopefully therapeutic.  Please see requesting physician notes for further details and justification.   ROS Otherwise per HPI.  Assessment & Plan: Visit Diagnoses:    ICD-10-CM   1. 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   Meds & Orders:  Meds ordered this encounter  Medications   methylPREDNISolone acetate (DEPO-MEDROL) injection 40 mg    Orders Placed This Encounter  Procedures   XR C-ARM NO REPORT   Epidural Steroid injection    Follow-up: Return for Hoyt Koch, MD.   Procedures: No procedures performed  Lumbar Epidural Steroid Injection - Interlaminar Approach with Fluoroscopic Guidance  Patient: Daniel Sheppard      Date of Birth: July 29, 1990 MRN: [id number] PCP: [name], PA-C      Visit Date: 02/21/2022   Universal Protocol:     Consent Given By: the patient  Position: PRONE  Additional Comments: Vital signs were monitored before and after the procedure. Patient was prepped and draped in the usual sterile fashion. The correct patient, procedure, and site was verified.   Injection Procedure Details:   Procedure diagnoses: Lumbar radiculopathy  [M54.16]   Meds Administered:  Meds ordered this encounter  Medications   methylPREDNISolone acetate (DEPO-MEDROL) injection 40 mg     Laterality: Right  Location/Site:  L4-5  Needle: 3.5 in., 20 ga. Tuohy  Needle Placement: Paramedian epidural  Findings:   -Comments: Excellent flow of contrast into the epidural space.  Procedure Details: Using a paramedian approach from the side mentioned above, the region overlying the inferior lamina was localized under fluoroscopic visualization and the soft tissues overlying this structure were infiltrated with 4 ml. of 1% Lidocaine without Epinephrine. The Tuohy needle was inserted into the epidural space using a paramedian approach.   The epidural space was localized using loss of resistance along with counter oblique bi-planar fluoroscopic views.  After negative aspirate for air, blood, and CSF, a 2 ml. volume of Isovue-250 was injected into the epidural space and the flow of contrast was observed. Radiographs were obtained for documentation purposes.    The injectate was administered into the level noted above.   Additional Comments:  The patient tolerated the procedure well Dressing: 2 x 2 sterile gauze and Band-Aid    Post-procedure details: Patient was observed during the procedure. Post-procedure instructions were reviewed.  Patient left the clinic in stable condition.   Clinical History: MRI LUMBAR SPINE WITHOUT CONTRAST   TECHNIQUE: Multiplanar, multisequence MR imaging of the lumbar spine was performed. No intravenous contrast was administered.   COMPARISON:  X-ray lumbar 07/06/2021.   FINDINGS: Segmentation: 5 non rib-bearing lumbar type vertebral bodies are present. The lowest fully formed vertebral body is  L5.   Alignment: No significant listhesis is present. Straightening of the normal lumbar lordosis is noted.   Vertebrae:  Marrow signal and vertebral body heights are normal.   Conus medullaris and cauda  equina: Conus extends to the L1 level. Conus and cauda equina appear normal.   Paraspinal and other soft tissues: Limited imaging the abdomen is unremarkable. There is no significant adenopathy. No solid organ lesions are present.   Disc levels:   L1-2: Mild loss of disc height noted. No significant focal protrusion or stenosis.   L2-3: Negative.   L3-4: Mild disc bulging is asymmetric to the left. Mild bilateral subarticular and foraminal narrowing is worse left than right.   L4-5: Broad-based disc protrusion is present. Mild subarticular narrowing is present bilaterally. Mild bilateral foraminal narrowing is present.   L5-S1: Disc desiccation noted. A mild central annular tear is present. No significant focal stenosis is present.   IMPRESSION: 1. Mild bilateral subarticular and foraminal narrowing bilaterally at L3-4 is worse left than right. 2. Mild subarticular and foraminal narrowing bilaterally at L4-5. 3. Mild central annular tear at L5-S1 without significant stenosis. 4. Straightening of the normal lumbar lordosis. This is nonspecific but can be seen in the setting of muscle strain or ongoing pain.     Electronically Signed   By: San Morelle M.D.   On: 07/28/2021 16:39     Objective:  VS:  HT:    WT:   BMI:     BP:   HR: bpm  TEMP: ( )  RESP:  Physical Exam Vitals and nursing note reviewed.  Constitutional:      General: He is not in acute distress.    Appearance: Normal appearance. He is not ill-appearing.  HENT:     Head: Normocephalic and atraumatic.     Right Ear: External ear normal.     Left Ear: External ear normal.     Nose: No congestion.  Eyes:     Extraocular Movements: Extraocular movements intact.  Cardiovascular:     Rate and Rhythm: Normal rate.     Pulses: Normal pulses.  Pulmonary:     Effort: Pulmonary effort is normal. No respiratory distress.  Abdominal:     General: There is no distension.     Palpations: Abdomen  is soft.  Musculoskeletal:        General: No tenderness or signs of injury.     Cervical back: Neck supple.     Right lower leg: No edema.     Left lower leg: No edema.     Comments: Patient has good distal strength without clonus.  Skin:    Findings: No erythema or rash.  Neurological:     General: No focal deficit present.     Mental Status: He is alert and oriented to person, place, and time.     Sensory: No sensory deficit.     Motor: No weakness or abnormal muscle tone.     Coordination: Coordination normal.  Psychiatric:        Mood and Affect: Mood normal.        Behavior: Behavior normal.      Imaging: No results found.

## 2022-09-13 IMAGING — DX DG HAND COMPLETE 3+V*R*
1 series · 3 of 3 positions shown · non-contrast
Comparison: None.

CLINICAL DATA: Crush injury.

EXAM:
RIGHT HAND - COMPLETE 3+ VIEW

[Series 1: hand · 0.14mm/px · 3 of 3 slices shown]
[im 1/3]
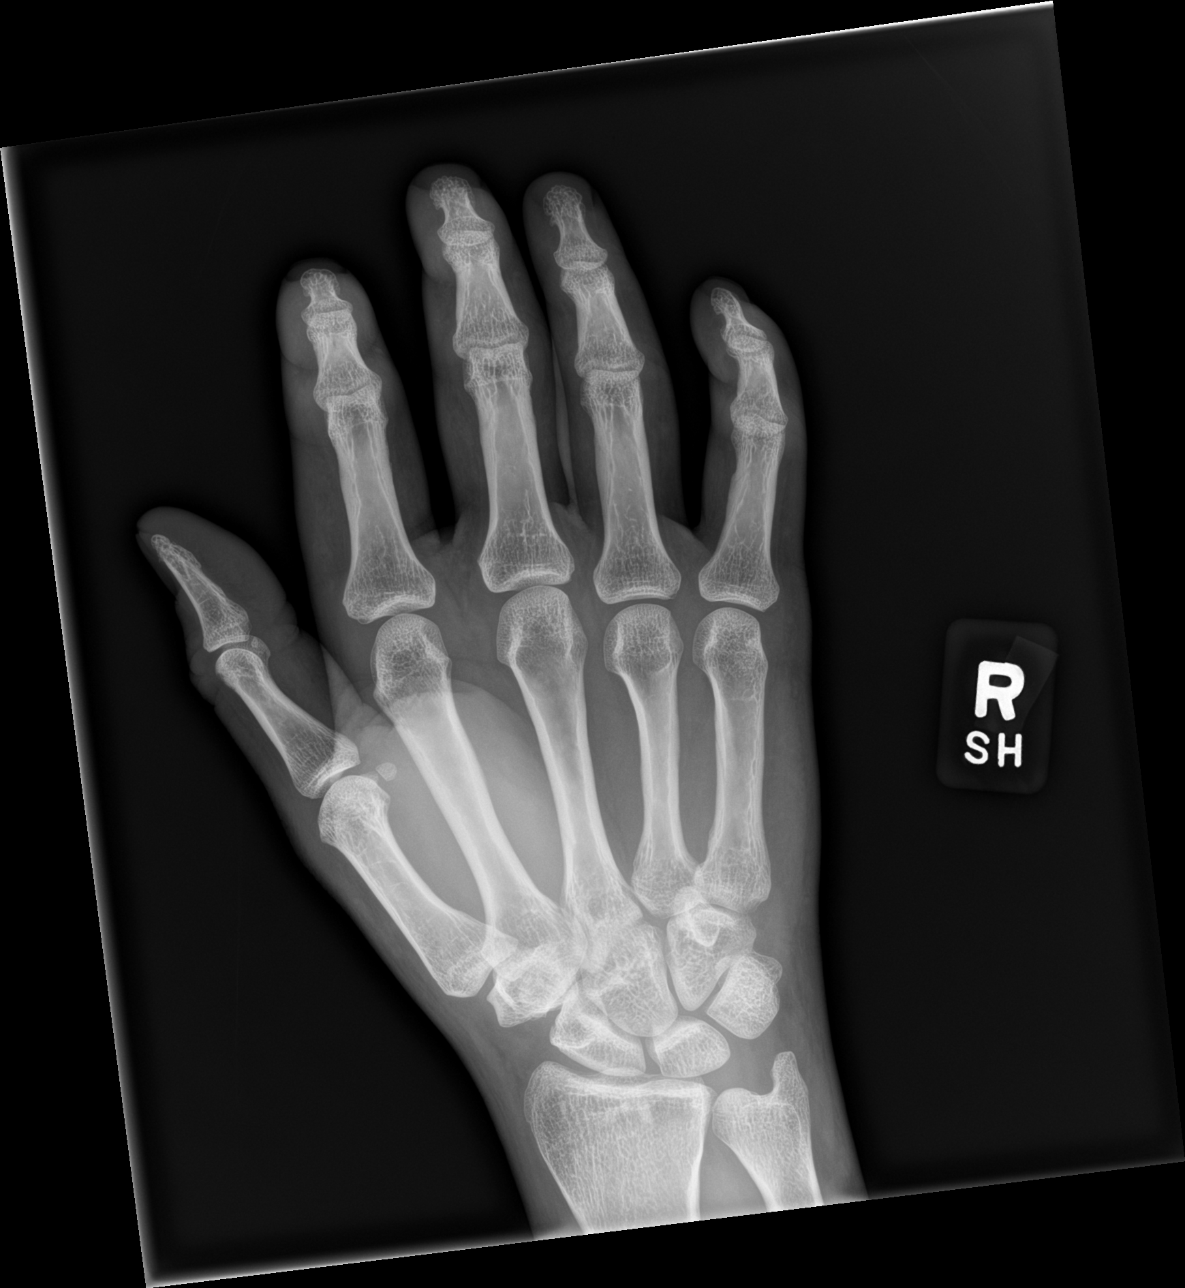
[im 2/3]
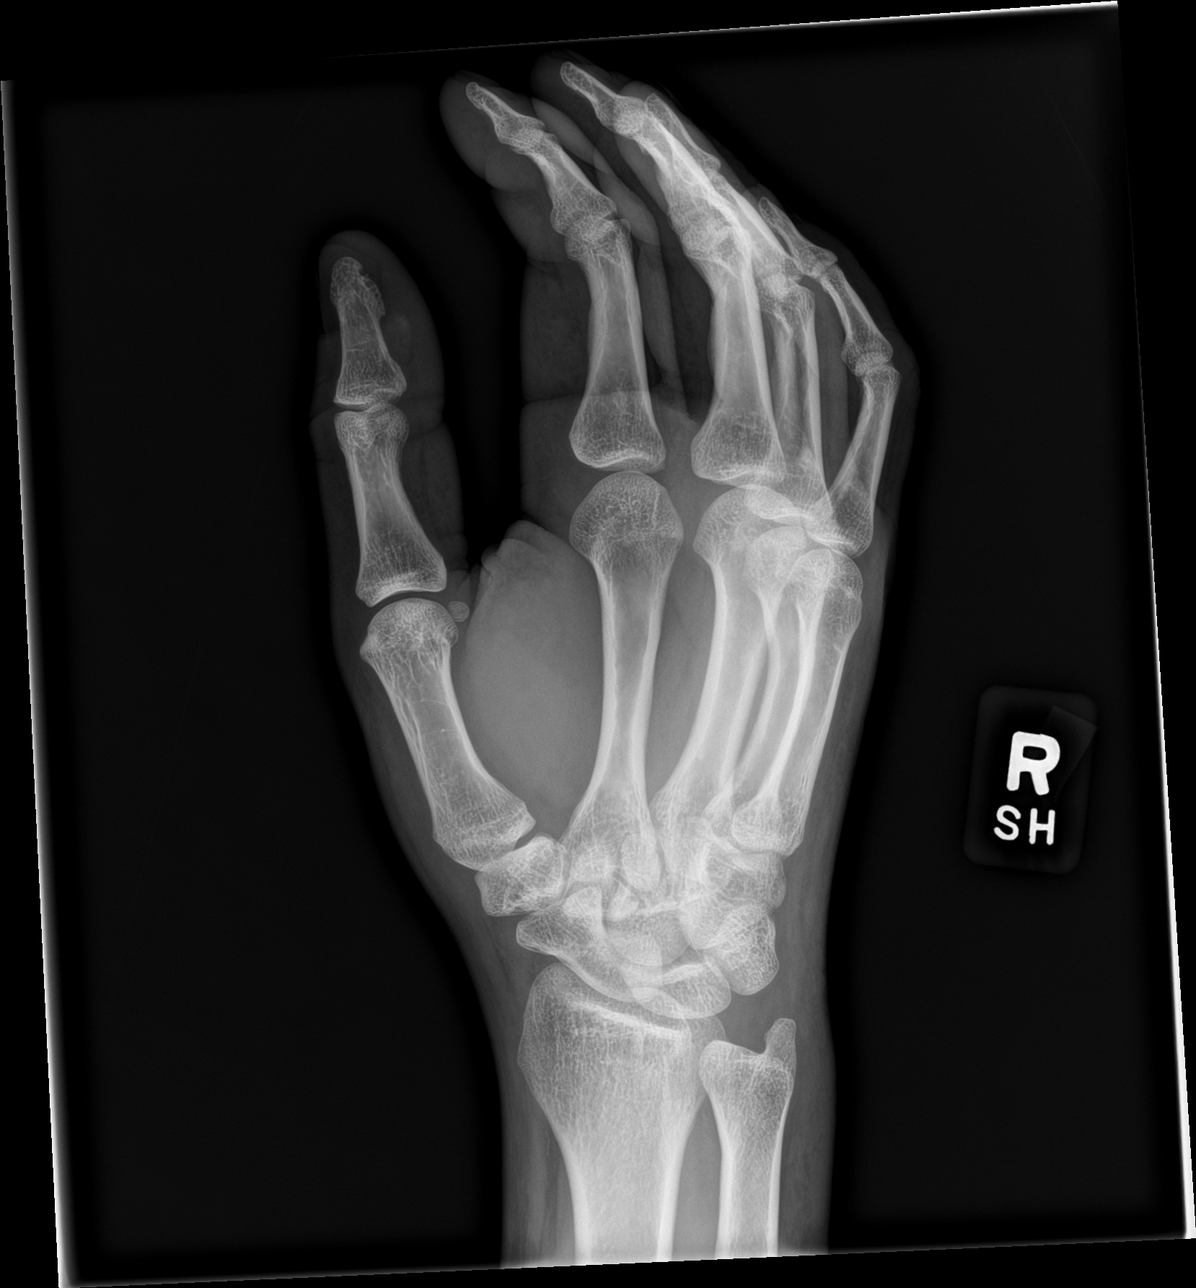
[im 3/3]
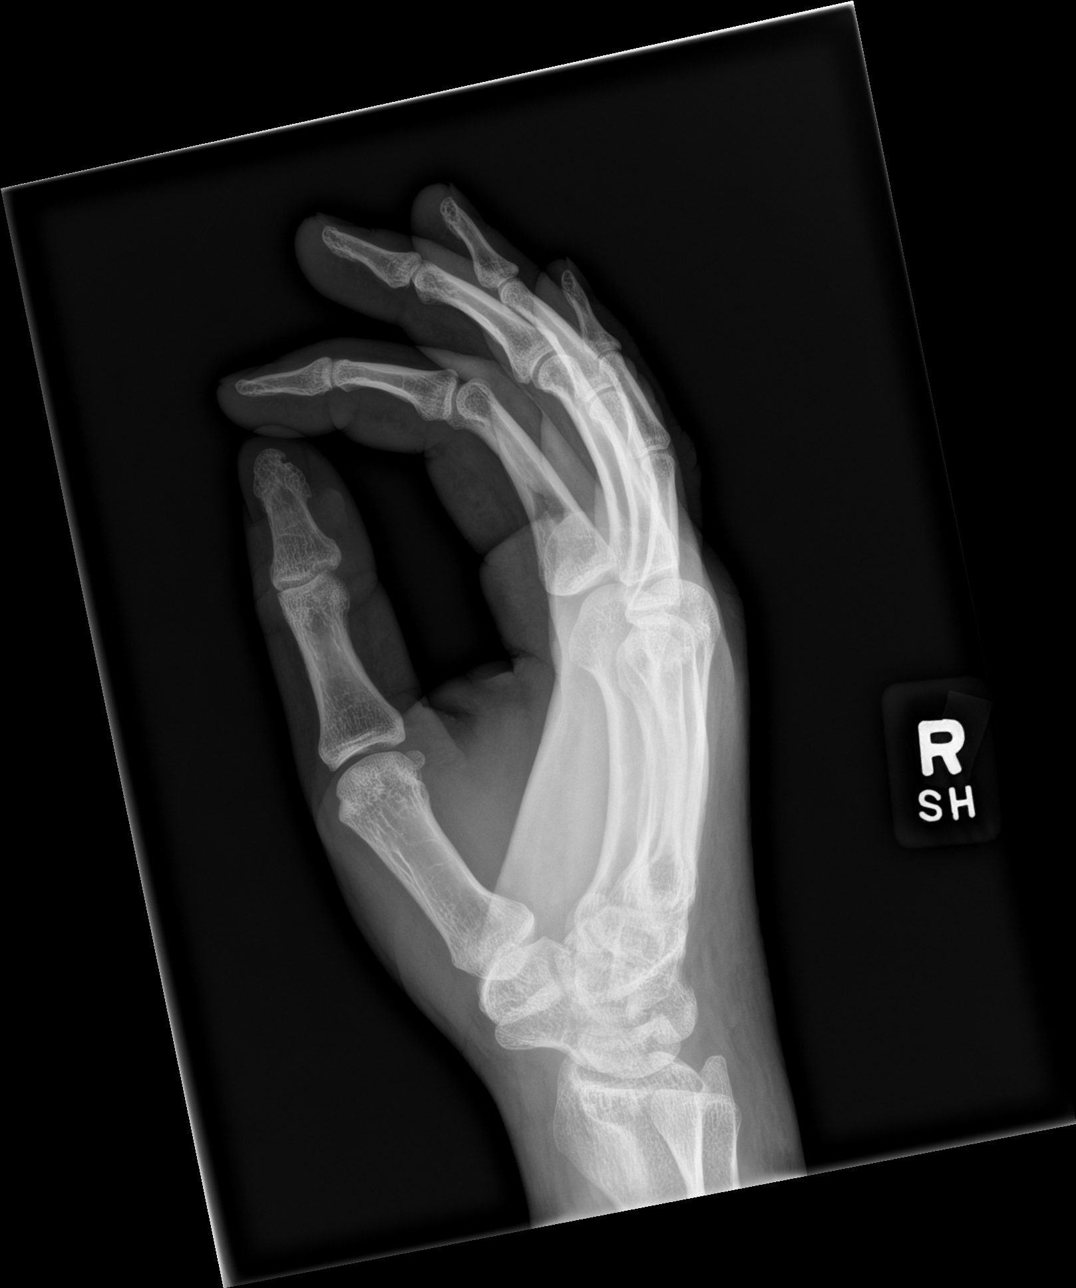

[3 of 3 positions shown; findings below may reference images not displayed]

FINDINGS: There is no evidence of fracture or dislocation. There is no
evidence of arthropathy or other focal bone abnormality. Soft tissue
edema overlies the dorsum of the hand. No radiopaque foreign body.
IMPRESSION: Dorsal soft tissue edema. No fracture or subluxation.

## 2022-10-07 NOTE — Progress Notes (Signed)
Cancelled.  

## 2022-12-16 ENCOUNTER — Ambulatory Visit
Admission: RE | Admit: 2022-12-16 | Discharge: 2022-12-16 | Disposition: A | Discharge status: Home / Self Care | Payer: Commercial Managed Care - PPO | Source: Ambulatory Visit | Attending: Family Medicine | Admitting: Family Medicine

## 2022-12-16 ENCOUNTER — Other Ambulatory Visit: Payer: Self-pay | Admitting: Family Medicine

## 2022-12-16 DIAGNOSIS — Z111 Encounter for screening for respiratory tuberculosis: Secondary | ICD-10-CM

## 2023-04-23 ENCOUNTER — Encounter (HOSPITAL_COMMUNITY): Payer: Self-pay

## 2023-04-23 ENCOUNTER — Ambulatory Visit (HOSPITAL_COMMUNITY)
Admission: RE | Admit: 2023-04-23 | Discharge: 2023-04-23 | Disposition: A | Discharge status: Home / Self Care | Payer: Commercial Managed Care - PPO | Source: Ambulatory Visit | Attending: Internal Medicine | Admitting: Internal Medicine

## 2023-04-23 VITALS — BP 110/68 | HR 64 | Temp 97.3°F | Resp 16

## 2023-04-23 DIAGNOSIS — G4489 Other headache syndrome: Secondary | ICD-10-CM | POA: Diagnosis not present

## 2023-04-23 MED ORDER — METHYLPREDNISOLONE ACETATE 40 MG/ML IJ SUSP: 20.0000 mg | Freq: Once | INTRAMUSCULAR | Status: DC

## 2023-04-23 MED ORDER — HYDROXYZINE HCL 25 MG PO TABS: 25.0000 mg | ORAL_TABLET | Freq: Four times a day (QID) | ORAL | 0 refills | Status: DC | PRN

## 2023-04-23 MED ORDER — HYDROXYZINE HCL 25 MG PO TABS: 25.0000 mg | ORAL_TABLET | Freq: Four times a day (QID) | ORAL | 1 refills | Status: DC | PRN

## 2023-04-23 MED ORDER — KETOROLAC TROMETHAMINE 30 MG/ML IJ SOLN: 30.0000 mg | Freq: Once | INTRAMUSCULAR | Status: DC

## 2023-04-23 NOTE — ED Triage Notes (Signed)
Pt c/o intermittent headaches for few days  States they have been getting worse.

## 2023-04-23 NOTE — ED Notes (Signed)
Pt did not received his injection. Provider notified.

## 2023-04-23 NOTE — ED Provider Notes (Signed)
[facility]    CSN: [id number] Arrival date & time: 04/23/23  1538      History   Chief Complaint Chief Complaint  Patient presents with   Headache    Intermittant headaches. Native Dari speaker. - Entered by patient    HPI Daniel Sheppard is a 32 y.o. male.   32 yr old male who presents to urgent care with complaints of headache. Headache that has been present for several days but had gotten worse. The symptoms are worse when lots of noise or when he is getting upset with someone. There is pressure along the back of the left neck and the left temporal region. Denies vision changes, unilateral symptoms, sinus symptoms, cough, fevers, chills, nausea. Doesn't happen often and does not have migraines.  He currently has no symptoms.   Headache Associated symptoms: no abdominal pain, no back pain, no congestion, no cough, no ear pain, no eye pain, no fever, no seizures, no sore throat, no vomiting and no weakness     History reviewed. No pertinent past medical history.  Patient Active Problem List   Diagnosis Date Noted   Pelvic pain 06/30/2021   Chronic bilateral low back pain with right-sided sciatica 06/30/2021    History reviewed. No pertinent surgical history.     Home Medications    Prior to Admission medications   Medication Sig Start Date End Date Taking? Authorizing Provider  diclofenac (VOLTAREN) 75 MG EC tablet Take 1 tablet (75 mg total) by mouth 2 (two) times daily as needed. 07/06/21   Cristie Hem, PA-C    Family History History reviewed. No pertinent family history.  Social History Social History   Tobacco Use   Smoking status: Never  Substance Use Topics   Alcohol use: Not Currently   Drug use: Not Currently     Allergies   Patient has no known allergies.   Review of Systems Review of Systems  Constitutional:  Negative for activity change, appetite change, chills and fever.  HENT:  Negative for congestion, ear pain and  sore throat.   Eyes:  Negative for pain and visual disturbance.  Respiratory:  Negative for cough and shortness of breath.   Cardiovascular:  Negative for chest pain and palpitations.  Gastrointestinal:  Negative for abdominal pain and vomiting.  Genitourinary:  Negative for dysuria and hematuria.  Musculoskeletal:  Negative for arthralgias and back pain.  Skin:  Negative for color change and rash.  Neurological:  Positive for headaches. Negative for tremors, seizures, syncope, speech difficulty and weakness.  All other systems reviewed and are negative.    Physical Exam Triage Vital Signs ED Triage Vitals  Encounter Vitals Group     BP 04/23/23 1617 110/68     Systolic BP Percentile --      Diastolic BP Percentile --      Pulse Rate 04/23/23 1617 64     Resp 04/23/23 1617 16     Temp 04/23/23 1617 (!) 97.3 F (36.3 C)     Temp Source 04/23/23 1617 Oral     SpO2 04/23/23 1617 96 %     Weight --      Height --      Head Circumference --      Peak Flow --      Pain Score 04/23/23 1620 7     Pain Loc --      Pain Education --      Exclude from Growth Chart --    No  data found.  Updated Vital Signs BP 110/68 (BP Location: Right Arm)   Pulse 64   Temp (!) 97.3 F (36.3 C) (Oral)   Resp 16   SpO2 96%   Visual Acuity Right Eye Distance:   Left Eye Distance:   Bilateral Distance:    Right Eye Near:   Left Eye Near:    Bilateral Near:     Physical Exam Vitals and nursing note reviewed.  Constitutional:      General: He is not in acute distress.    Appearance: He is well-developed.  HENT:     Head: Normocephalic and atraumatic.     Mouth/Throat:     Mouth: Mucous membranes are moist.  Eyes:     Extraocular Movements: Extraocular movements intact.     Right eye: Normal extraocular motion and no nystagmus.     Left eye: Normal extraocular motion and no nystagmus.     Conjunctiva/sclera: Conjunctivae normal.     Pupils: Pupils are equal, round, and reactive to  light.  Cardiovascular:     Rate and Rhythm: Normal rate and regular rhythm.     Heart sounds: No murmur heard. Pulmonary:     Effort: Pulmonary effort is normal. No respiratory distress.     Breath sounds: Normal breath sounds.  Abdominal:     Palpations: Abdomen is soft.     Tenderness: There is no abdominal tenderness.  Musculoskeletal:        General: No swelling.     Cervical back: Neck supple.  Skin:    General: Skin is warm and dry.     Capillary Refill: Capillary refill takes less than 2 seconds.  Neurological:     Mental Status: He is alert and oriented to person, place, and time. Mental status is at baseline.     Cranial Nerves: No cranial nerve deficit or facial asymmetry.     Sensory: No sensory deficit.     Motor: No weakness.     Coordination: Coordination normal.     Gait: Gait normal.  Psychiatric:        Mood and Affect: Mood normal. Mood is not anxious.        Speech: Speech normal.        Behavior: Behavior normal.      UC Treatments / Results  Labs (all labs ordered are listed, but only abnormal results are displayed) Labs Reviewed - No data to display  EKG   Radiology No results found.  Procedures Procedures (including critical care time)  Medications Ordered in UC Medications - No data to display  Initial Impression / Assessment and Plan / UC Course  I have reviewed the triage vital signs and the nursing notes.  Pertinent labs & imaging results that were available during my care of the patient were reviewed by me and considered in my medical decision making (see chart for details).     Other headache syndrome   Patient's headaches are most likely stress induced.  He does seem to have a high level of anxiety and stress associated with his daily activities.  Have offered him hydroxyzine that he can take every 6 hours as needed and made him aware that this can cause some mild drowsiness.  As he does not currently have a headache or other  symptoms we have not done any intervention here in the office. Return to urgent care or PCP if symptoms worsen or fail to resolve.    Final Clinical Impressions(s) / UC Diagnoses  Final diagnoses:  None   Discharge Instructions   None    ED Prescriptions   None    PDMP not reviewed this encounter.   Landis Martins, [place] 04/23/23 1905

## 2023-04-23 NOTE — Discharge Instructions (Addendum)
Intermittent headache secondary to stress. No headache now.  Will give a prescription for hydroxyzine every 6 hours as needed for anxiety/stress.  Return to urgent care or PCP if symptoms worsen or fail to resolve.

## 2023-11-21 ENCOUNTER — Ambulatory Visit: Admitting: Family

## 2023-11-21 ENCOUNTER — Encounter: Payer: Self-pay | Admitting: Family

## 2023-11-21 VITALS — BP 110/60 | HR 65 | Temp 98.2°F | Ht 75.5 in | Wt 186.2 lb

## 2023-11-21 DIAGNOSIS — M5441 Lumbago with sciatica, right side: Secondary | ICD-10-CM

## 2023-11-21 DIAGNOSIS — G8929 Other chronic pain: Secondary | ICD-10-CM | POA: Diagnosis not present

## 2023-11-21 MED ORDER — DICLOFENAC SODIUM 75 MG PO TBEC: 75.0000 mg | DELAYED_RELEASE_TABLET | Freq: Two times a day (BID) | ORAL | 2 refills | Status: AC | PRN

## 2023-11-21 NOTE — Progress Notes (Signed)
 Patient ID: Daniel Sheppard, male    DOB: 12-21-1990, 33 y.o.   MRN: [id number]  Chief Complaint  Patient presents with   Back Pain    Has had back pain 6-7 years. It's not getting worst but it has been the same. Standing after 20 minutes pain is an 8. Sitting no pain.   Discussed the use of AI scribe software for clinical note transcription with the patient, who gave verbal consent to proceed.  History of Present Illness Daniel Sheppard is a 33 year old male with chronic back pain who presents for follow-up.  He underwent an MRI and was referred to an orthopedic provider for chronic back pain in 2023. He received two steroid injections, with the last one in September 2023, providing relief for 22 days. He did not pursue further treatment after the injections. Physical therapy sessions attended three times offered some relief and improved leg symptoms. He is not currently taking any pain medication. The pain feels like his back is 'splitting apart,' especially when standing for over 20 minutes. Sitting is more comfortable. 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 There is no leg weakness, bowel or bladder incontinence, or numbness.  Assessment & Plan Chronic Low Back Pain Chronic low back pain with limited relief from previous treatments. Occupational factors may contribute. Caution with pain medication due to risk of ulcers and kidney damage. - Refer back to orthopedic specialist for evaluation and management. - Consider new MRI to assess current condition. - Refill Diclofenac  Sodium 75mg  bid as needed, with caution to avoid long-term use. - Advise taking pain medication with food to minimize gastrointestinal side effects. - Call office if no response from Ortho office. Phone number provided on AVS to give to pt sponsor who speaks english.  Subjective:    Outpatient Medications Prior to Visit  Medication Sig Dispense Refill   diclofenac  (VOLTAREN ) 75 MG EC  tablet Take 1 tablet (75 mg total) by mouth 2 (two) times daily as needed. (Patient not taking: Reported on 11/21/2023) 60 tablet 2   hydrOXYzine  (ATARAX ) 25 MG tablet Take 1 tablet (25 mg total) by mouth every 6 (six) hours as needed. (Patient not taking: Reported on 11/21/2023) 30 tablet 1   No facility-administered medications prior to visit.   No past medical history on file. No past surgical history on file. No Known Allergies    Objective:    Physical Exam Vitals and nursing note reviewed.  Constitutional:      General: He is not in acute distress.    Appearance: Normal appearance.  HENT:     Head: Normocephalic.   Cardiovascular:     Rate and Rhythm: Normal rate and regular rhythm.  Pulmonary:     Effort: Pulmonary effort is normal.     Breath sounds: Normal breath sounds.   Musculoskeletal:     Cervical back: Normal range of motion.     Lumbar back: Tenderness and bony tenderness present. Decreased range of motion.   Skin:    General: Skin is warm and dry.   Neurological:     Mental Status: He is alert and oriented to person, place, and time.   Psychiatric:        Mood and Affect: Mood normal.    BP 110/60   Pulse 65   Temp 98.2 F (36.8 C) (Temporal)   Ht 6' 3.5 (1.918 m)   Wt 186 lb 3.2 oz (84.5 kg)   SpO2 97%  BMI 22.97 kg/m  Wt Readings from Last 3 Encounters:  11/21/23 186 lb 3.2 oz (84.5 kg)  06/30/21 165 lb 9.6 oz (75.1 kg)       Versa Gore, NP

## 2023-11-21 NOTE — Patient Instructions (Addendum)
 It was very nice to see you today!   I have sent the referral to the Orthopedic office again. The Dr. Christiane Cowing office location and phone number is listed below. I also sent in a refill of the Diclofenac  sodium pills for your back pain to use as needed.  Please schedule a physical with fasting labs when convenient for you.    PLEASE NOTE:  If you had any lab tests please let us  know if you have not heard back within a few days. You may see your results on MyChart before we have a chance to review them but we will give you a call once they are reviewed by us . If we ordered any referrals today, please let us  know if you have not heard from their office within the next week.

## 2023-12-19 ENCOUNTER — Encounter: Admitting: Physician Assistant
